# Patient Record
Sex: Male | Born: 1965 | Race: Black or African American | Hispanic: No | Marital: Single | Smoking: Current every day smoker
Health system: Southern US, Community
[De-identification: ages and names within clinical notes are randomized; demographics above are authoritative.]

## PROBLEM LIST (undated history)

## (undated) DIAGNOSIS — I10 Essential (primary) hypertension: Secondary | ICD-10-CM

---

## 2015-11-08 ENCOUNTER — Emergency Department: Payer: Self-pay

## 2015-11-08 ENCOUNTER — Encounter: Payer: Self-pay | Admitting: Emergency Medicine

## 2015-11-08 ENCOUNTER — Emergency Department
Admission: EM | Admit: 2015-11-08 | Discharge: 2015-11-08 | Disposition: A | Discharge status: Home / Self Care | Payer: Self-pay | Attending: Emergency Medicine | Admitting: Emergency Medicine

## 2015-11-08 DIAGNOSIS — M25432 Effusion, left wrist: Secondary | ICD-10-CM | POA: Insufficient documentation

## 2015-11-08 DIAGNOSIS — I10 Essential (primary) hypertension: Secondary | ICD-10-CM | POA: Insufficient documentation

## 2015-11-08 DIAGNOSIS — M25532 Pain in left wrist: Secondary | ICD-10-CM

## 2015-11-08 DIAGNOSIS — F17200 Nicotine dependence, unspecified, uncomplicated: Secondary | ICD-10-CM | POA: Insufficient documentation

## 2015-11-08 HISTORY — DX: Essential (primary) hypertension: I10

## 2015-11-08 MED ORDER — TRAMADOL HCL 50 MG PO TABS: 50.0000 mg | ORAL_TABLET | Freq: Four times a day (QID) | ORAL | 0 refills | Status: AC | PRN

## 2015-11-08 MED ORDER — IBUPROFEN 800 MG PO TABS: 800.0000 mg | ORAL_TABLET | Freq: Three times a day (TID) | ORAL | 0 refills | Status: AC | PRN

## 2015-11-08 MED ORDER — KETOROLAC TROMETHAMINE 60 MG/2ML IM SOLN
60.0000 mg | Freq: Once | INTRAMUSCULAR | Status: AC
  Administered 2015-11-08: 60 mg via INTRAMUSCULAR
  Filled 2015-11-08: qty 2

## 2015-11-08 NOTE — ED Triage Notes (Signed)
Pt with left wrist pain and swelling for two days. No known injury,.

## 2015-11-08 NOTE — ED Provider Notes (Signed)
[facility] Emergency Department Provider Note   ____________________________________________   First MD Initiated Contact with Patient 11/08/15 [phone]     (approximate)  I have reviewed the triage vital signs and the nursing notes.   HISTORY  Chief Complaint Wrist Pain    HPI Glenn Howell is a 50 y.o. male presents for evaluation of left wrist pain and swelling for 2 days. Denies any known injury. States pain started yesterday and progressively worse unable to sleep last night secondary to throbbing pain. Reports his pain is 10 over 10.   Past Medical History:  Diagnosis Date  . Hypertension     There are no active problems to display for this patient.   History reviewed. No pertinent surgical history.  Prior to Admission medications   Medication Sig Start Date End Date Taking? Authorizing Provider  ibuprofen (ADVIL,MOTRIN) 800 MG tablet Take 1 tablet (800 mg total) by mouth every 8 (eight) hours as needed. 11/08/15   Charmayne Sheer Tana Trefry, PA-C  traMADol (ULTRAM) 50 MG tablet Take 1 tablet (50 mg total) by mouth every 6 (six) hours as needed. 11/08/15 11/07/16  Evangeline Dakin, PA-C    Allergies Review of patient's allergies indicates no known allergies.  No family history on file.  Social History Social History  Substance Use Topics  . Smoking status: Current Every Day Smoker  . Smokeless tobacco: Never Used  . Alcohol use Yes    Review of Systems Constitutional: No fever/chills Cardiovascular: Denies chest pain. Respiratory: Denies shortness of breath. Musculoskeletal: Positive for left wrist pain. Skin: Negative for rash. Neurological: Negative for headaches, focal weakness or numbness.  10-point ROS otherwise negative.  ____________________________________________   PHYSICAL EXAM:  VITAL SIGNS: ED Triage Vitals  Enc Vitals Group     BP 11/08/15 0724 (!) 160/113     Pulse Rate 11/08/15 0724 100     Resp 11/08/15 0724 20   Temp 11/08/15 0724 97.9 F (36.6 C)     Temp Source 11/08/15 0724 Oral     SpO2 11/08/15 0724 100 %     Weight 11/08/15 0725 205 lb (93 kg)     Height 11/08/15 0725 5\' 10"  (1.778 m)     Head Circumference --      Peak Flow --      Pain Score 11/08/15 0725 8     Pain Loc --      Pain Edu? --      Excl. in GC? --     Constitutional: Alert and oriented. Well appearing and in no acute distress. Cardiovascular: Normal rate, regular rhythm. Grossly normal heart sounds.  Good peripheral circulation. Respiratory: Normal respiratory effort.  No retractions. Lungs CTAB. Musculoskeletal: Left wrist with limited range of motion. Mild warmth. Increased pain with flexion and extension. Distally neurovascularly intact with good capillary refill. Neurologic:  Normal speech and language. No gross focal neurologic deficits are appreciated. No gait instability. Skin:  Skin is warm, dry and intact. No rash noted. Psychiatric: Mood and affect are normal. Speech and behavior are normal.  ____________________________________________   LABS (all labs ordered are listed, but only abnormal results are displayed)  Labs Reviewed - No data to display ____________________________________________  EKG   ____________________________________________  RADIOLOGY  No acute osseous findings. ____________________________________________   PROCEDURES  Procedure(s) performed: None  Procedures  Critical Care performed: No  ____________________________________________   INITIAL IMPRESSION / ASSESSMENT AND PLAN / ED COURSE  Pertinent labs & imaging results that were available  during my care of the patient were reviewed by me and considered in my medical decision making (see chart for details).  Nonspecific left wrist pain. Ace wrap provided as needed for comfort Rx given for ibuprofen and tramadol. She didn't follow up PCP or orthopedics on call if continued pain.  Clinical Course      ____________________________________________   FINAL CLINICAL IMPRESSION(S) / ED DIAGNOSES  Final diagnoses:  Left wrist pain  Wrist pain, acute, left      NEW MEDICATIONS STARTED DURING THIS VISIT:  New Prescriptions   IBUPROFEN (ADVIL,MOTRIN) 800 MG TABLET    Take 1 tablet (800 mg total) by mouth every 8 (eight) hours as needed.   TRAMADOL (ULTRAM) 50 MG TABLET    Take 1 tablet (50 mg total) by mouth every 6 (six) hours as needed.     Note:  This document was prepared using Dragon voice recognition software and may include unintentional dictation errors.   Evangeline Dakinharles M Anvay Tennis, PA-C 11/08/15 0911    Evangeline Dakinharles M Mervyn Pflaum, PA-C 11/08/15 1045    Myrna Blazeravid Matthew Schaevitz, MD 11/08/15 [phone]

## 2019-07-10 ENCOUNTER — Emergency Department: Payer: Self-pay

## 2019-07-10 ENCOUNTER — Inpatient Hospital Stay
Admission: EM | Admit: 2019-07-10 | Discharge: 2019-07-11 | DRG: 281 | Discharge status: Against Medical Advice | Payer: Self-pay | Attending: Internal Medicine | Admitting: Internal Medicine

## 2019-07-10 ENCOUNTER — Other Ambulatory Visit: Payer: Self-pay

## 2019-07-10 DIAGNOSIS — Z79899 Other long term (current) drug therapy: Secondary | ICD-10-CM

## 2019-07-10 DIAGNOSIS — Z20822 Contact with and (suspected) exposure to covid-19: Secondary | ICD-10-CM | POA: Diagnosis present

## 2019-07-10 DIAGNOSIS — D649 Anemia, unspecified: Secondary | ICD-10-CM

## 2019-07-10 DIAGNOSIS — F1010 Alcohol abuse, uncomplicated: Secondary | ICD-10-CM | POA: Diagnosis present

## 2019-07-10 DIAGNOSIS — F17200 Nicotine dependence, unspecified, uncomplicated: Secondary | ICD-10-CM | POA: Diagnosis present

## 2019-07-10 DIAGNOSIS — R7401 Elevation of levels of liver transaminase levels: Secondary | ICD-10-CM | POA: Diagnosis present

## 2019-07-10 DIAGNOSIS — I10 Essential (primary) hypertension: Secondary | ICD-10-CM | POA: Diagnosis present

## 2019-07-10 DIAGNOSIS — D500 Iron deficiency anemia secondary to blood loss (chronic): Secondary | ICD-10-CM | POA: Diagnosis present

## 2019-07-10 DIAGNOSIS — R1011 Right upper quadrant pain: Secondary | ICD-10-CM

## 2019-07-10 DIAGNOSIS — I214 Non-ST elevation (NSTEMI) myocardial infarction: Principal | ICD-10-CM | POA: Diagnosis present

## 2019-07-10 DIAGNOSIS — I959 Hypotension, unspecified: Secondary | ICD-10-CM | POA: Diagnosis present

## 2019-07-10 DIAGNOSIS — K922 Gastrointestinal hemorrhage, unspecified: Secondary | ICD-10-CM | POA: Diagnosis present

## 2019-07-10 DIAGNOSIS — K760 Fatty (change of) liver, not elsewhere classified: Secondary | ICD-10-CM | POA: Diagnosis present

## 2019-07-10 DIAGNOSIS — N170 Acute kidney failure with tubular necrosis: Secondary | ICD-10-CM

## 2019-07-10 DIAGNOSIS — R195 Other fecal abnormalities: Secondary | ICD-10-CM | POA: Diagnosis present

## 2019-07-10 DIAGNOSIS — E871 Hypo-osmolality and hyponatremia: Secondary | ICD-10-CM | POA: Diagnosis present

## 2019-07-10 DIAGNOSIS — N179 Acute kidney failure, unspecified: Secondary | ICD-10-CM | POA: Diagnosis present

## 2019-07-10 LAB — CBC
HCT: 24.3 % — ABNORMAL LOW (ref 39.0–52.0)
Hemoglobin: 6.6 g/dL — ABNORMAL LOW (ref 13.0–17.0)
MCH: 17.4 pg — ABNORMAL LOW (ref 26.0–34.0)
MCHC: 27.2 g/dL — ABNORMAL LOW (ref 30.0–36.0)
MCV: 63.9 fL — ABNORMAL LOW (ref 80.0–100.0)
Platelets: 736 10*3/uL — ABNORMAL HIGH (ref 150–400)
RBC: 3.8 MIL/uL — ABNORMAL LOW (ref 4.22–5.81)
RDW: 21.1 % — ABNORMAL HIGH (ref 11.5–15.5)
WBC: 10.6 10*3/uL — ABNORMAL HIGH (ref 4.0–10.5)
nRBC: 0 % (ref 0.0–0.2)

## 2019-07-10 LAB — PROTIME-INR
INR: 1 (ref 0.8–1.2)
Prothrombin Time: 12.9 seconds (ref 11.4–15.2)

## 2019-07-10 LAB — BASIC METABOLIC PANEL
Anion gap: 7 (ref 5–15)
BUN: 17 mg/dL (ref 6–20)
CO2: 23 mmol/L (ref 22–32)
Calcium: 8.3 mg/dL — ABNORMAL LOW (ref 8.9–10.3)
Chloride: 104 mmol/L (ref 98–111)
Creatinine, Ser: 1.39 mg/dL — ABNORMAL HIGH (ref 0.61–1.24)
GFR calc Af Amer: >60 mL/min (ref >60)
GFR calc non Af Amer: 57 mL/min — ABNORMAL LOW (ref >60)
Glucose, Bld: 109 mg/dL — ABNORMAL HIGH (ref 70–99)
Potassium: 3.6 mmol/L (ref 3.5–5.1)
Sodium: 134 mmol/L — ABNORMAL LOW (ref 135–145)

## 2019-07-10 LAB — HEPATIC FUNCTION PANEL
ALT: 39 U/L (ref 0–44)
AST: 96 U/L — ABNORMAL HIGH (ref 15–41)
Albumin: 3.3 g/dL — ABNORMAL LOW (ref 3.5–5.0)
Alkaline Phosphatase: 72 U/L (ref 38–126)
Bilirubin, Direct: <0.1 mg/dL (ref 0.0–0.2)
Total Bilirubin: 0.7 mg/dL (ref 0.3–1.2)
Total Protein: 6.5 g/dL (ref 6.5–8.1)

## 2019-07-10 LAB — APTT: aPTT: 37 seconds — ABNORMAL HIGH (ref 24–36)

## 2019-07-10 LAB — MRSA PCR SCREENING: MRSA by PCR: NEGATIVE

## 2019-07-10 LAB — TROPONIN I (HIGH SENSITIVITY)
Troponin I (High Sensitivity): 9635 ng/L (ref <18)
Troponin I (High Sensitivity): 11923 ng/L (ref <18)

## 2019-07-10 LAB — LIPASE, BLOOD: Lipase: 23 U/L (ref 11–51)

## 2019-07-10 LAB — ABO/RH: ABO/RH(D): O POS

## 2019-07-10 LAB — PREPARE RBC (CROSSMATCH)

## 2019-07-10 MED ORDER — SODIUM CHLORIDE 0.9 % IV BOLUS: Freq: Once | INTRAVENOUS | Status: DC

## 2019-07-10 MED ORDER — DOCUSATE SODIUM 100 MG PO CAPS: 100.0000 mg | ORAL_CAPSULE | Freq: Two times a day (BID) | ORAL | Status: DC | PRN

## 2019-07-10 MED ORDER — ASPIRIN 81 MG PO CHEW: 324.0000 mg | CHEWABLE_TABLET | Freq: Once | ORAL | Status: DC

## 2019-07-10 MED ORDER — SODIUM CHLORIDE 0.9 % IV BOLUS
500.0000 mL | Freq: Once | INTRAVENOUS | Status: AC
  Administered 2019-07-10: 500 mL via INTRAVENOUS

## 2019-07-10 MED ORDER — HEPARIN (PORCINE) 25000 UT/250ML-% IV SOLN
1000.0000 [IU]/h | INTRAVENOUS | Status: DC
  Administered 2019-07-10: 1000 [IU]/h via INTRAVENOUS
  Filled 2019-07-10: qty 250

## 2019-07-10 MED ORDER — ONDANSETRON HCL 4 MG/2ML IJ SOLN: 4.0000 mg | Freq: Four times a day (QID) | INTRAMUSCULAR | Status: DC | PRN

## 2019-07-10 MED ORDER — LORAZEPAM 2 MG/ML IJ SOLN: 1.0000 mg | INTRAMUSCULAR | Status: DC | PRN

## 2019-07-10 MED ORDER — MORPHINE SULFATE (PF) 2 MG/ML IV SOLN
1.0000 mg | INTRAVENOUS | Status: DC | PRN
  Administered 2019-07-11: 2 mg via INTRAVENOUS
  Filled 2019-07-10: qty 1

## 2019-07-10 MED ORDER — SODIUM CHLORIDE 0.9 % IV SOLN
80.0000 mg | Freq: Once | INTRAVENOUS | Status: AC
  Administered 2019-07-10: 80 mg via INTRAVENOUS
  Filled 2019-07-10: qty 80

## 2019-07-10 MED ORDER — NITROGLYCERIN IN D5W 200-5 MCG/ML-% IV SOLN
0.0000 ug/min | INTRAVENOUS | Status: DC
  Administered 2019-07-10: 5 ug/min via INTRAVENOUS
  Filled 2019-07-10: qty 250

## 2019-07-10 MED ORDER — CHLORHEXIDINE GLUCONATE CLOTH 2 % EX PADS
6.0000 | MEDICATED_PAD | Freq: Every day | CUTANEOUS | Status: DC
  Administered 2019-07-10 – 2019-07-11 (×2): 6 via TOPICAL

## 2019-07-10 MED ORDER — LORAZEPAM 1 MG PO TABS: 1.0000 mg | ORAL_TABLET | ORAL | Status: DC | PRN

## 2019-07-10 MED ORDER — THIAMINE HCL 100 MG/ML IJ SOLN
100.0000 mg | Freq: Every day | INTRAMUSCULAR | Status: DC
  Administered 2019-07-11: 100 mg via INTRAVENOUS
  Filled 2019-07-10: qty 2

## 2019-07-10 MED ORDER — NITROGLYCERIN 0.4 MG SL SUBL
0.4000 mg | SUBLINGUAL_TABLET | SUBLINGUAL | Status: DC | PRN
  Administered 2019-07-10 (×2): 0.4 mg via SUBLINGUAL
  Filled 2019-07-10: qty 1

## 2019-07-10 MED ORDER — PANTOPRAZOLE SODIUM 40 MG IV SOLR: 40.0000 mg | Freq: Two times a day (BID) | INTRAVENOUS | Status: DC

## 2019-07-10 MED ORDER — THIAMINE HCL 100 MG PO TABS: 100.0000 mg | ORAL_TABLET | Freq: Every day | ORAL | Status: DC

## 2019-07-10 MED ORDER — ACETAMINOPHEN 325 MG PO TABS: 650.0000 mg | ORAL_TABLET | ORAL | Status: DC | PRN

## 2019-07-10 MED ORDER — SODIUM CHLORIDE 0.9 % IV SOLN
8.0000 mg/h | INTRAVENOUS | Status: DC
  Administered 2019-07-10 – 2019-07-11 (×2): 8 mg/h via INTRAVENOUS
  Filled 2019-07-10 (×2): qty 80

## 2019-07-10 MED ORDER — FOLIC ACID 5 MG/ML IJ SOLN
1.0000 mg | Freq: Every day | INTRAMUSCULAR | Status: DC
  Filled 2019-07-10 (×2): qty 0.2

## 2019-07-10 NOTE — ED Notes (Signed)
Pt showing 12 beats of ventricular tachycardia on monitor. Associate Professor notified. Triplett, FNP provided with printed report.

## 2019-07-10 NOTE — ED Provider Notes (Signed)
[facility] Emergency Department Provider Note  ____________________________________________   First MD Initiated Contact with Patient 07/10/19 1656     (approximate)  I have reviewed the triage vital signs and the nursing notes.   HISTORY  Chief Complaint Chest Pain  HPI Glenn Howell is a 54 y.o. male who presents emergency department for treatment and evaluation of 2 days of chest pain.  Patient states that he was at work when he noticed his chest feeling kind of tight.  He thought that it was indigestion and so he took some over-the-counter heartburn relief medications.   He denies previous cardiac disease.  He has never had a reason to be evaluated by cardiology.  He does have a history of hypertension.  He states that the pain has gotten worse today. No appetite for the past 24 hours.    Past Medical History:  Diagnosis Date   Hypertension     Patient Active Problem List   Diagnosis Date Noted   GI bleed 07/10/2019    History reviewed. No pertinent surgical history.  Prior to Admission medications   Medication Sig Start Date End Date Taking? Authorizing Provider  ibuprofen (ADVIL,MOTRIN) 800 MG tablet Take 1 tablet (800 mg total) by mouth every 8 (eight) hours as needed. 11/08/15   Beers, Charmayne Sheer, PA-C    Allergies Patient has no known allergies.  No family history on file.  Social History Social History   Tobacco Use   Smoking status: Current Every Day Smoker   Smokeless tobacco: Never Used  Substance Use Topics   Alcohol use: Yes   Drug use: Not on file    Review of Systems  Constitutional: No fever/chills. Eyes: No visual changes. ENT: No sore throat. Cardiovascular: Positive for chest pain. Negative for pleuritic pain. Negative for palpitations. Negataive for leg pain. Respiratory: Negative shortness of breath. Gastrointestinal: Positive for right upper abdominal pain. Negative for nausea, No vomiting.  No diarrhea.   No constipation. Genitourinary: Negative for dysuria. Musculoskeletal: Positive for neck and back pain.  Skin: Negative for rash, lesion, wound. Neurological: Negative for headaches, focal weakness or numbness.  ____________________________________________   PHYSICAL EXAM:  VITAL SIGNS: ED Triage Vitals  Enc Vitals Group     BP 07/10/19 1640 125/70     Pulse Rate 07/10/19 1640 87     Resp 07/10/19 1640 20     Temp 07/10/19 1644 (!) 97.5 F (36.4 C)     Temp Source 07/10/19 1644 Oral     SpO2 07/10/19 1644 96 %     Weight 07/10/19 1642 250 lb (113.4 kg)     Height 07/10/19 1642 5\' 11"  (1.803 m)     Head Circumference --      Peak Flow --      Pain Score 07/10/19 1641 10     Pain Loc --      Pain Edu? --      Excl. in GC? --     Constitutional: Alert and oriented. Acutely ill appearing and in no acute distress. normal mental status. Eyes: Conjunctivae are normal. PERRL. Head: Atraumatic. Nose: No congestion/rhinnorhea. Mouth/Throat: Mucous membranes are moist.  Oropharynx non-erythematous. Tongue normal in size and color. Neck: No stridor. No carotid bruit appreciated on exam. Hematological/Lymphatic/Immunilogical: No cervical lymphadenopathy. Cardiovascular: Normal rate, regular rhythm. Grossly normal heart sounds.  Good peripheral circulation. Respiratory: Normal respiratory effort.  No retractions. Lungs CTAB. Gastrointestinal: Soft and tender in RUQ on exam. Rotund. No abdominal bruits. No  CVA tenderness.  Genitourinary: Exam deferred. Musculoskeletal: No lower extremity tenderness. No edema of extremities. Neurologic:  Normal speech and language. No gross focal neurologic deficits are appreciated. Skin:  Skin is warm, dry and intact. No rash noted. Psychiatric: Mood and affect are normal. Speech and behavior are normal.  ____________________________________________   LABS (all labs ordered are listed, but only abnormal results are displayed)  Labs Reviewed    BASIC METABOLIC PANEL - Abnormal; Notable for the following components:      Result Value   Sodium 134 (*)    Glucose, Bld 109 (*)    Creatinine, Ser 1.39 (*)    Calcium 8.3 (*)    GFR calc non Af Amer 57 (*)    All other components within normal limits  CBC - Abnormal; Notable for the following components:   WBC 10.6 (*)    RBC 3.80 (*)    Hemoglobin 6.6 (*)    HCT 24.3 (*)    MCV 63.9 (*)    MCH 17.4 (*)    MCHC 27.2 (*)    RDW 21.1 (*)    Platelets 736 (*)    All other components within normal limits  HEPATIC FUNCTION PANEL - Abnormal; Notable for the following components:   Albumin 3.3 (*)    AST 96 (*)    All other components within normal limits  APTT - Abnormal; Notable for the following components:   aPTT 37 (*)    All other components within normal limits  TROPONIN I (HIGH SENSITIVITY) - Abnormal; Notable for the following components:   Troponin I (High Sensitivity) 9,635 (*)    All other components within normal limits  TROPONIN I (HIGH SENSITIVITY) - Abnormal; Notable for the following components:   Troponin I (High Sensitivity) 11,923 (*)    All other components within normal limits  SARS CORONAVIRUS 2 BY RT PCR (HOSPITAL ORDER, PERFORMED IN [facility])  PROTIME-INR  LIPASE, BLOOD  IRON AND TIBC  FERRITIN  CBC  HEPARIN LEVEL (UNFRACTIONATED)  HIV ANTIBODY (ROUTINE TESTING W REFLEX)  PHOSPHORUS  MAGNESIUM  MAGNESIUM  PHOSPHORUS  TYPE AND SCREEN  PREPARE RBC (CROSSMATCH)  ABO/RH   ____________________________________________  EKG  ED ECG REPORT I, Amariona Rathje, FNP-BC personally viewed and interpreted this ECG.   Date: 07/10/2019  EKG Time: 1642  Rate: 83  Rhythm: Sinus rhythm with PACs  Axis: normal  Intervals:none  ST&T Change: ST depression V5 V6.  ED ECG REPORT I, Delora Gravatt, FNP-BC personally viewed and interpreted this ECG.   Date: 07/10/2019  EKG Time: 1756  Rate: 85  Rhythm: unchanged from previous tracings   Axis: normal  Intervals:none  ST&T Change: ST depression V5 V6.   ____________________________________________  RADIOLOGY  ED MD interpretation:  Cardiomegaly likely.  I, Kem Boroughs, personally viewed and evaluated these images (plain radiographs) as part of my medical decision making, as well as reviewing the written report by the radiologist.  Official radiology report(s): DG Chest Portable 1 View  Result Date: 07/10/2019 CLINICAL DATA:  Chest pain. EXAM: PORTABLE CHEST 1 VIEW COMPARISON:  None. FINDINGS: Apparent cardiomegaly. Haziness over the left base. No overt edema. No nodules or masses. No other acute abnormalities. IMPRESSION: Haziness over the left base may be due to patient rotation. It would be difficult to exclude a developing opacity. A PA and lateral chest x-ray is recommended for better evaluation. No overt edema. Cardiomegaly suspected. Electronically Signed   By: Gerome Sam III M.D  On: 07/10/2019 17:11   US Abdomen Limited RUQ  Result Date: 07/10/2019 CLINICAL DATA:  Right upper quadrant pain times 2-3 days EXAM: ULTRASOUND ABDOMEN LIMITED RIGHT UPPER QUADRANT COMPARISON:  None. FINDINGS: Gallbladder: Multiple small, shadowing echogenic gallstones are seen within the gallbladder lumen (the largest measures approximately 8 mm). There is no evidence of gallbladder wall thickening (1.5 mm). No sonographic Murphy sign noted by sonographer. Common bile duct: Diameter: 2.4 mm Liver: No focal lesion identified. There is diffusely increased echogenicity of the liver parenchyma. Portal vein is patent on color Doppler imaging with normal direction of blood flow towards the liver. Other: None. IMPRESSION: 1. Cholelithiasis, without evidence of acute cholecystitis. 2. Fatty liver. Electronically Signed   By: Aram Candela M.D.   On: 07/10/2019 17:40    ____________________________________________   PROCEDURES  Procedure(s) performed: None  Procedures  Critical  Care performed: Yes, see critical care note(s)  ____________________________________________   INITIAL IMPRESSION / ASSESSMENT AND PLAN  54 year old male presenting to the emergency department via EMS for treatment and evaluation of chest pain that started 2 days ago.  On exam he also has some right upper quadrant tenderness.  Plan will be to do a cardiac work-up as well as ultrasound of the right upper quadrant to rule out cholecystitis.  Differential diagnosis includes, but not limited to:  CAD, MI, acute cholecystitis   ED COURSE  Troponin 9,635. Chest pain 6/10. Will order NTG and heparin. No change on ECG compared to initial.  Hemoglobin 6.6. Patient denies a known history of anemia. He has noticed blood when wiping after bowel movement for the past few months. He has never had a colonoscopy.  Heme positive without gross melena on DRE.  Discussed with the patient the need to transfuse blood.  Patient is agreeable and nursing staff will obtain consent.  Run of Vtach noted on bedside monitor. Converted without intervention.   Cardiology paged for consult. Spoke with Dr. Darrold Junker who does not plan to take him to the cath lab emergently as the pain started 2 days ago and he is anemic requiring blood.   Patient's pain some better, but not completely relieved. Nitroglycerin drip ordered. Second troponin 11,923.   Accepted for admission by Intensivist.   As part of my medical decision making, I reviewed the following data within the electronic MEDICAL RECORD NUMBER Evaluated by EM attending Dr. Scotty Court.  CRITICAL CARE Performed by: Kem Boroughs   Total critical care time: 45 minutes  Critical care time was exclusive of separately billable procedures and treating other patients.  Critical care was necessary to treat or prevent imminent or life-threatening deterioration.  Critical care was time spent personally by me on the following activities: development of treatment plan with  patient and/or surrogate as well as nursing, discussions with consultants, evaluation of patient's response to treatment, examination of patient, obtaining history from patient or surrogate, ordering and performing treatments and interventions, ordering and review of laboratory studies, ordering and review of radiographic studies, pulse oximetry and re-evaluation of patient's condition.  ____________________________________________     FINAL CLINICAL IMPRESSION(S) / ED DIAGNOSES  Final diagnoses:  RUQ pain  NSTEMI (non-ST elevated myocardial infarction) (HCC)  Anemia, unspecified type     ED Discharge Orders    None       NORM WRAY was evaluated in Emergency Department on 07/10/2019 for the symptoms described in the history of present illness. He was evaluated in the context of the global COVID-19 pandemic, which necessitated consideration  that the patient might be at risk for infection with the SARS-CoV-2 virus that causes COVID-19. Institutional protocols and algorithms that pertain to the evaluation of patients at risk for COVID-19 are in a state of rapid change based on information released by regulatory bodies including the CDC and federal and state organizations. These policies and algorithms were followed during the patient's care in the ED.   Note:  This document was prepared using Dragon voice recognition software and may include unintentional dictation errors.   Victorino Dike, FNP 07/10/19 2012    Carrie Mew, MD 07/10/19 2020

## 2019-07-10 NOTE — ED Notes (Signed)
Patient assisted to the bathroom 

## 2019-07-10 NOTE — ED Triage Notes (Signed)
Pt arrives via EMS from home with complaints of 10/10 CP that started yesterday while he was at work- pt was given 3 nitro sprays and 324 ASA by EMS- pt having swelling in bilateral lower extremities pt BP dropped after 3rd nitro to 85 systolic and pt became confused

## 2019-07-10 NOTE — ED Notes (Signed)
Patient given warm blankets.

## 2019-07-10 NOTE — Consult Note (Addendum)
PHARMACY CONSULT NOTE - FOLLOW UP  Pharmacy Consult for Electrolyte Monitoring and Replacement   Recent Labs: Potassium (mmol/L)  Date Value  07/10/2019 3.6   Calcium (mg/dL)  Date Value  44/04/4740 8.3 (L)   Albumin (g/dL)  Date Value  59/56/3875 3.3 (L)   Sodium (mmol/L)  Date Value  07/10/2019 134 (L)     Assessment: Pharmacy has been consulted to monitor and replace electrolytes in 53yo patient admitted to ICU with acute GI bleed and NSTEMI requiring nitroglycerin drip.   Goal of Therapy:  Electrolytes WNL, K~4.0, Mg~2.0  Plan:  No replacement needed currently.  Will recheck electrolytes with AM labs.  Bettey Costa ,PharmD Clinical Pharmacist 07/10/2019 9:54 PM

## 2019-07-10 NOTE — ED Notes (Signed)
Attempted to call report but states that they are not ready at this time.

## 2019-07-10 NOTE — Progress Notes (Signed)
Called to consider emergent cardiac catheterization and possible PCI. Patient with 1-2 day history of chest pain and elevated troponin ( 9635 ), without evidence for STEMI ( ECG show SR with lateral T wave abnormalities ). Also, patient with marked anemia  H & H, 6.6 & 24.3, respectively with heme positive stools consistent with active GI bleed. Cardiac cath and PCI would require heparin bolus of 10-15,000 units to achieve ACT 300, plus dual antiplatelet therapy which would likely exacerbate GI bleed. Would recommend against emergent cardiac cath and proceed with initial conservative management with transfusion to achieve target H & H, 10 & 30, respectively.

## 2019-07-10 NOTE — Progress Notes (Signed)
eLink Physician-Brief Progress Note Patient Name: Glenn Howell DOB: 11/21/1965 MRN: [id number]   Date of Service  07/10/2019  HPI/Events of Note  54 year old man being admitted with acute MI and GI bleed. Current hemodynamics are stable. Planned for IV PPI, nitrates, blood transfusion, GI and cardiology consults.   eICU Interventions  Defer MI management to cardiology Planned for stabilization before cath, 2 units RBC and then post transfusion CBC to be done Would aim for Hb close to 10 given acute MI Bedside CCM admitting Please call E link if needed Will need serial CBC, renal labs and cardiac markers     Intervention Category Major Interventions: Hemorrhage - evaluation and management;Other: Evaluation Type: New Patient Evaluation  Oretha Milch 07/10/2019, 10:23 PM

## 2019-07-10 NOTE — ED Notes (Addendum)
Assigned bed @ 2012, spoke with RN Elon Jester

## 2019-07-10 NOTE — H&P (Addendum)
Name: Glenn Howell MRN: [id number] DOB: 07/14/1965    ADMISSION DATE:  07/10/2019 CONSULTATION DATE: 07/10/2019  REFERRING MD : Sherrie George, NP   CHIEF COMPLAINT: Chest Pain   BRIEF PATIENT DESCRIPTION:  54 yo male admitted with acute GI bleed and NSTEMI requiring nitroglycerin gtt   SIGNIFICANT EVENTS/STUDIES:  05/23: Pt admitted to ICU with acute GI bleed and NSTEMI  05/23: [place] Abd Limited RUQ revealed cholelithiasis, without evidence of acute cholecystitis. Fatty liver  HISTORY OF PRESENT ILLNESS:   This is a 54 yo male with a PMH of HTN, ETOH Abuse, and Tobacco Abuse. He presented to [facility] ER on 05/23 with c/o 10/10 chest pain and decreased appetite, onset 05/22 while he was at work.  He initially thought the chest pain was secondary to indigestion, therefore he took over the counter heartburn medication.  However, due to worsening chest pain today he notified EMS.  Upon EMS arrival pt c/o 10/10 chest pain, and noted to have bilateral lower extremity swelling.  EMS administered 3 nitro sprays and aspirin. Following the 3rd nitro pt developed confusion and became hypotensive sbp 85.  In the ER EKG revealed normal sinus rhythm with ST depression.  Lab results revealed: Na+ 134, glucose 109, creatinine 1.39, calcium 8.3, AST 96, troponin 9,635, hgb 6.6, hct 24.3, and occult stool positive. Pt endorsed noticing blood in his stool over the past few months. He denies taking NSAID's, anticoagulants, or herbal remedies, however he does endorse ETOH abuse stating he drinks alcohol every other day, but is uncertain of the amount.  He denies drug abuse.  CXR concerning for possible LLL opacity. Pt also complained of persistent chest pain (6/10), therefore nitroglycerin gtt ordered. Cardiologist Dr. Saralyn Pilar consulted, pt did not meet STEMI criteria. Dr. Lorinda Creed recommended against emergent cardiac cath due to acute GI bleed. PCCM team contacted by ER provider for ICU admission.   PAST MEDICAL HISTORY  :   has a past medical history of Hypertension.  has no past surgical history on file. Prior to Admission medications   Medication Sig Start Date End Date Taking? Authorizing Provider  ibuprofen (ADVIL,MOTRIN) 800 MG tablet Take 1 tablet (800 mg total) by mouth every 8 (eight) hours as needed. 11/08/15   Beers, Pierce Crane, PA-C   No Known Allergies  FAMILY HISTORY:  family history is not on file. SOCIAL HISTORY:  reports that he has been smoking. He has never used smokeless tobacco. He reports current alcohol use.  REVIEW OF SYSTEMS: Positives in BOLD  Constitutional: Negative for fever, chills, weight loss, malaise/fatigue and diaphoresis.  HENT: Negative for hearing loss, ear pain, nosebleeds, congestion, sore throat, neck pain, tinnitus and ear discharge.   Eyes: Negative for blurred vision, double vision, photophobia, pain, discharge and redness.  Respiratory: Negative for cough, hemoptysis, sputum production, shortness of breath, wheezing and stridor.   Cardiovascular: chest pain, palpitations, orthopnea, claudication, leg swelling and PND.  Gastrointestinal: heartburn, nausea, vomiting, abdominal pain, diarrhea, constipation, blood in stool and melena.  Genitourinary: Negative for dysuria, urgency, frequency, hematuria and flank pain.  Musculoskeletal: Negative for myalgias, back pain, joint pain and falls.  Skin: Negative for itching and rash.  Neurological: Negative for dizziness, tingling, tremors, sensory change, speech change, focal weakness, seizures, loss of consciousness, weakness and headaches.  Endo/Heme/Allergies: Negative for environmental allergies and polydipsia. Does not bruise/bleed easily.  SUBJECTIVE:  Pt c/o shortness of breath and 7/10 chest pain   VITAL SIGNS: Temp:  [97.5 F (36.4 C)-97.6 F (36.4  C)] 97.5 F (36.4 C) (05/23 2003) Pulse Rate:  [80-87] 86 (05/23 2003) Resp:  [13-25] 20 (05/23 2003) BP: (102-135)/(55-87) 135/82 (05/23 2003) SpO2:  [96  %-99 %] 99 % (05/23 2003) Weight:  [113.4 kg] 113.4 kg (05/23 1642)  PHYSICAL EXAMINATION: General: well developed, well nourished male, NAD resting in bed  Neuro: alert and oriented, follows commands HEENT: supple, no JVD  Cardiovascular: nsr, rrr, no R/G Lungs: faint rhonchi bilateral bases, slightly tachypneic  Abdomen: +BS x4, obese, soft, non tender, non distended Musculoskeletal: normal bulk and tone Skin: intact no rashes or lesions present   Recent Labs  Lab 07/10/19 1647  NA 134*  K 3.6  CL 104  CO2 23  BUN 17  CREATININE 1.39*  GLUCOSE 109*   Recent Labs  Lab 07/10/19 1647  HGB 6.6*  HCT 24.3*  WBC 10.6*  PLT 736*   DG Chest Portable 1 View  Result Date: 07/10/2019 CLINICAL DATA:  Chest pain. EXAM: PORTABLE CHEST 1 VIEW COMPARISON:  None. FINDINGS: Apparent cardiomegaly. Haziness over the left base. No overt edema. No nodules or masses. No other acute abnormalities. IMPRESSION: Haziness over the left base may be due to patient rotation. It would be difficult to exclude a developing opacity. A PA and lateral chest x-ray is recommended for better evaluation. No overt edema. Cardiomegaly suspected. Electronically Signed   By: Gerome Sam III M.D   On: 07/10/2019 17:11   US Abdomen Limited RUQ  Result Date: 07/10/2019 CLINICAL DATA:  Right upper quadrant pain times 2-3 days EXAM: ULTRASOUND ABDOMEN LIMITED RIGHT UPPER QUADRANT COMPARISON:  None. FINDINGS: Gallbladder: Multiple small, shadowing echogenic gallstones are seen within the gallbladder lumen (the largest measures approximately 8 mm). There is no evidence of gallbladder wall thickening (1.5 mm). No sonographic Murphy sign noted by sonographer. Common bile duct: Diameter: 2.4 mm Liver: No focal lesion identified. There is diffusely increased echogenicity of the liver parenchyma. Portal vein is patent on color Doppler imaging with normal direction of blood flow towards the liver. Other: None. IMPRESSION: 1.  Cholelithiasis, without evidence of acute cholecystitis. 2. Fatty liver. Electronically Signed   By: Aram Candela M.D.   On: 07/10/2019 17:40    ASSESSMENT / PLAN:  Elevated troponin secondary to NSTEMI Hx: HTN  Continuous telemetry monitoring Supplemental O2 for dyspnea and/or hypoxia   Trend troponin Prn morphine and nitroglycerin gtt for chest pain management  Echo, lipid panel, and hemoglobin A1c pending  Urine drug screen pending  Cardiology consulted appreciate input-deferring cardiac cath at this time due to acute GI bleed   Acute renal failure likely secondary to poor po intake  Mild hyponatremia  Trend BMP  Replace electrolytes as indicated  Monitor UOP Avoid nephrotoxic medications  NS @75  ml/hr   New dx of fatty liver secondary to ETOH abuse  Trend hepatic panel   Possible LLL opacity  Trend WBC and follow fever curve  Will check PCT if elevated will initiated abx therapy   Acute GI Bleed  VTE px: SCD's, avoid chemical prophylaxis  Trend CBC  Monitor for s/sx of bleeding and transfuse for hgb 10 and hct 30 per cardiology recommendations  Gastroenterology consulted appreciate input  Protonix gtt   ETOH and Tobacco Abuse  CIWA protocol  ETOH and tobacco abuse cessation counseling provided   , AGNP  Pulmonary/Critical Care Pager [phone] (please enter 7 digits) PCCM Consult Pager [phone] (please enter 7 digits)

## 2019-07-10 NOTE — Consult Note (Addendum)
ANTICOAGULATION CONSULT NOTE  Pharmacy Consult for Heparin infusion Indication: chest pain/ACS  No Known Allergies  Patient Measurements: Height: 5\' 11"  (180.3 cm) Weight: 113.4 kg (250 lb) IBW/kg (Calculated) : 75.3 Heparin Dosing Weight: 99.9 kg  Vital Signs: Temp: 97.5 F (36.4 C) (05/23 1644) Temp Source: Oral (05/23 1644) BP: 125/70 (05/23 1640) Pulse Rate: 87 (05/23 1640)  Labs: Recent Labs    07/10/19 1647  HGB 6.6*  HCT 24.3*  PLT 736*  LABPROT 12.9  INR 1.0  CREATININE 1.39*  TROPONINIHS 9,635*    Estimated Creatinine Clearance: 76.9 mL/min (A) (by C-G formula based on SCr of 1.39 mg/dL (H)).   Medications:  No anticoagulation prior to admission per chart review  Assessment: Patient is a 54 y/o M who presented to the ED complaining of chest pain. Troponin elevated to 9635. EKG interpretation pending. Pharmacy has been consulted to initiate heparin infusion for ACS.  Baseline INR 1. Baseline aPTT 37. Baseline CBC significant for Hgb 6.6, platelets 736. Spoke with provider - confirmed okay to proceed with heparin infusion. Patient will be transfused 2 units PRBC.   Goal of Therapy:  Heparin level 0.3-0.7 units/ml Monitor platelets by anticoagulation protocol: Yes   Plan:  -Heparin infusion at 1000 units/hr, no bolus -Heparin level 6 hours after initiation of infusion -Daily CBC per protocol  53  Pharmacy Resident 07/10/2019,6:05 PM

## 2019-07-10 NOTE — ED Notes (Signed)
Report given to Michele, RN.

## 2019-07-11 ENCOUNTER — Inpatient Hospital Stay
Admit: 2019-07-11 | Discharge: 2019-07-11 | Disposition: A | Discharge status: Home / Self Care | Payer: Self-pay | Attending: Critical Care Medicine | Admitting: Critical Care Medicine

## 2019-07-11 DIAGNOSIS — K625 Hemorrhage of anus and rectum: Secondary | ICD-10-CM

## 2019-07-11 DIAGNOSIS — D508 Other iron deficiency anemias: Secondary | ICD-10-CM

## 2019-07-11 DIAGNOSIS — K922 Gastrointestinal hemorrhage, unspecified: Secondary | ICD-10-CM

## 2019-07-11 DIAGNOSIS — I214 Non-ST elevation (NSTEMI) myocardial infarction: Secondary | ICD-10-CM

## 2019-07-11 LAB — PHOSPHORUS
Phosphorus: 2.7 mg/dL (ref 2.5–4.6)
Phosphorus: 3.3 mg/dL (ref 2.5–4.6)
Phosphorus: 3.4 mg/dL (ref 2.5–4.6)

## 2019-07-11 LAB — URINALYSIS, ROUTINE W REFLEX MICROSCOPIC
Bacteria, UA: NONE SEEN
Bilirubin Urine: NEGATIVE
Glucose, UA: NEGATIVE mg/dL
Ketones, ur: NEGATIVE mg/dL
Leukocytes,Ua: NEGATIVE
Nitrite: NEGATIVE
Protein, ur: NEGATIVE mg/dL
Specific Gravity, Urine: 1.019 (ref 1.005–1.030)
pH: 5 (ref 5.0–8.0)

## 2019-07-11 LAB — IRON AND TIBC
Iron: 16 ug/dL — ABNORMAL LOW (ref 45–182)
Saturation Ratios: 4 % — ABNORMAL LOW (ref 17.9–39.5)
TIBC: 410 ug/dL (ref 250–450)
UIBC: 394 ug/dL

## 2019-07-11 LAB — LIPID PANEL
Cholesterol: 137 mg/dL (ref 0–200)
HDL: 25 mg/dL — ABNORMAL LOW (ref >40)
LDL Cholesterol: 93 mg/dL (ref 0–99)
Total CHOL/HDL Ratio: 5.5 RATIO
Triglycerides: 94 mg/dL (ref <150)
VLDL: 19 mg/dL (ref 0–40)

## 2019-07-11 LAB — COMPREHENSIVE METABOLIC PANEL
ALT: 41 U/L (ref 0–44)
AST: 131 U/L — ABNORMAL HIGH (ref 15–41)
Albumin: 3.4 g/dL — ABNORMAL LOW (ref 3.5–5.0)
Alkaline Phosphatase: 71 U/L (ref 38–126)
Anion gap: 7 (ref 5–15)
BUN: 15 mg/dL (ref 6–20)
CO2: 23 mmol/L (ref 22–32)
Calcium: 8.4 mg/dL — ABNORMAL LOW (ref 8.9–10.3)
Chloride: 104 mmol/L (ref 98–111)
Creatinine, Ser: 1.13 mg/dL (ref 0.61–1.24)
GFR calc Af Amer: >60 mL/min (ref >60)
GFR calc non Af Amer: >60 mL/min (ref >60)
Glucose, Bld: 120 mg/dL — ABNORMAL HIGH (ref 70–99)
Potassium: 3.9 mmol/L (ref 3.5–5.1)
Sodium: 134 mmol/L — ABNORMAL LOW (ref 135–145)
Total Bilirubin: 1.5 mg/dL — ABNORMAL HIGH (ref 0.3–1.2)
Total Protein: 6.7 g/dL (ref 6.5–8.1)

## 2019-07-11 LAB — HIV ANTIBODY (ROUTINE TESTING W REFLEX): HIV Screen 4th Generation wRfx: NONREACTIVE

## 2019-07-11 LAB — CBC WITH DIFFERENTIAL/PLATELET
Abs Immature Granulocytes: 0.06 10*3/uL (ref 0.00–0.07)
Basophils Absolute: 0 10*3/uL (ref 0.0–0.1)
Basophils Relative: 0 %
Eosinophils Absolute: 0 10*3/uL (ref 0.0–0.5)
Eosinophils Relative: 0 %
HCT: 29.5 % — ABNORMAL LOW (ref 39.0–52.0)
Hemoglobin: 8.5 g/dL — ABNORMAL LOW (ref 13.0–17.0)
Immature Granulocytes: 0 %
Lymphocytes Relative: 9 %
Lymphs Abs: 1.4 10*3/uL (ref 0.7–4.0)
MCH: 19.6 pg — ABNORMAL LOW (ref 26.0–34.0)
MCHC: 28.8 g/dL — ABNORMAL LOW (ref 30.0–36.0)
MCV: 68.1 fL — ABNORMAL LOW (ref 80.0–100.0)
Monocytes Absolute: 1.9 10*3/uL — ABNORMAL HIGH (ref 0.1–1.0)
Monocytes Relative: 13 %
Neutro Abs: 11.5 10*3/uL — ABNORMAL HIGH (ref 1.7–7.7)
Neutrophils Relative %: 78 %
Platelets: 682 10*3/uL — ABNORMAL HIGH (ref 150–400)
RBC: 4.33 MIL/uL (ref 4.22–5.81)
RDW: 24.6 % — ABNORMAL HIGH (ref 11.5–15.5)
WBC: 14.8 10*3/uL — ABNORMAL HIGH (ref 4.0–10.5)
nRBC: 0 % (ref 0.0–0.2)

## 2019-07-11 LAB — SARS CORONAVIRUS 2 BY RT PCR (HOSPITAL ORDER, PERFORMED IN ~~LOC~~ HOSPITAL LAB): SARS Coronavirus 2: NEGATIVE

## 2019-07-11 LAB — BASIC METABOLIC PANEL
Anion gap: 9 (ref 5–15)
BUN: 17 mg/dL (ref 6–20)
CO2: 21 mmol/L — ABNORMAL LOW (ref 22–32)
Calcium: 8.3 mg/dL — ABNORMAL LOW (ref 8.9–10.3)
Chloride: 104 mmol/L (ref 98–111)
Creatinine, Ser: 1.28 mg/dL — ABNORMAL HIGH (ref 0.61–1.24)
GFR calc Af Amer: >60 mL/min (ref >60)
GFR calc non Af Amer: >60 mL/min (ref >60)
Glucose, Bld: 105 mg/dL — ABNORMAL HIGH (ref 70–99)
Potassium: 3.8 mmol/L (ref 3.5–5.1)
Sodium: 134 mmol/L — ABNORMAL LOW (ref 135–145)

## 2019-07-11 LAB — PREPARE RBC (CROSSMATCH)

## 2019-07-11 LAB — CBC
HCT: 25.4 % — ABNORMAL LOW (ref 39.0–52.0)
Hemoglobin: 7.4 g/dL — ABNORMAL LOW (ref 13.0–17.0)
MCH: 18.9 pg — ABNORMAL LOW (ref 26.0–34.0)
MCHC: 29.1 g/dL — ABNORMAL LOW (ref 30.0–36.0)
MCV: 65 fL — ABNORMAL LOW (ref 80.0–100.0)
Platelets: 704 10*3/uL — ABNORMAL HIGH (ref 150–400)
RBC: 3.91 MIL/uL — ABNORMAL LOW (ref 4.22–5.81)
RDW: 23.9 % — ABNORMAL HIGH (ref 11.5–15.5)
WBC: 12.2 10*3/uL — ABNORMAL HIGH (ref 4.0–10.5)
nRBC: 0 % (ref 0.0–0.2)

## 2019-07-11 LAB — URINE DRUG SCREEN, QUALITATIVE (ARMC ONLY)
Amphetamines, Ur Screen: NOT DETECTED
Barbiturates, Ur Screen: NOT DETECTED
Benzodiazepine, Ur Scrn: NOT DETECTED
Cannabinoid 50 Ng, Ur ~~LOC~~: NOT DETECTED
Cocaine Metabolite,Ur ~~LOC~~: NOT DETECTED
MDMA (Ecstasy)Ur Screen: NOT DETECTED
Methadone Scn, Ur: NOT DETECTED
Opiate, Ur Screen: POSITIVE — AB
Phencyclidine (PCP) Ur S: NOT DETECTED
Tricyclic, Ur Screen: NOT DETECTED

## 2019-07-11 LAB — FERRITIN: Ferritin: 4 ng/mL — ABNORMAL LOW (ref 24–336)

## 2019-07-11 LAB — GLUCOSE, CAPILLARY: Glucose-Capillary: 101 mg/dL — ABNORMAL HIGH (ref 70–99)

## 2019-07-11 LAB — VITAMIN B12: Vitamin B-12: 210 pg/mL (ref 180–914)

## 2019-07-11 LAB — TROPONIN I (HIGH SENSITIVITY): Troponin I (High Sensitivity): 19604 ng/L (ref <18)

## 2019-07-11 LAB — FOLATE: Folate: 5.8 ng/mL — ABNORMAL LOW (ref >5.9)

## 2019-07-11 LAB — HEMOGLOBIN A1C
Hgb A1c MFr Bld: 5.9 % — ABNORMAL HIGH (ref 4.8–5.6)
Mean Plasma Glucose: 122.63 mg/dL

## 2019-07-11 LAB — PROCALCITONIN: Procalcitonin: 0.13 ng/mL

## 2019-07-11 LAB — MAGNESIUM
Magnesium: 1.9 mg/dL (ref 1.7–2.4)
Magnesium: 2 mg/dL (ref 1.7–2.4)
Magnesium: 2.1 mg/dL (ref 1.7–2.4)

## 2019-07-11 LAB — HEMOGLOBIN AND HEMATOCRIT, BLOOD
HCT: 30.1 % — ABNORMAL LOW (ref 39.0–52.0)
Hemoglobin: 8.7 g/dL — ABNORMAL LOW (ref 13.0–17.0)

## 2019-07-11 MED ORDER — SODIUM CHLORIDE 0.9% IV SOLUTION: Freq: Once | INTRAVENOUS | Status: DC

## 2019-07-11 MED ORDER — MAGNESIUM SULFATE IN D5W 1-5 GM/100ML-% IV SOLN
1.0000 g | Freq: Once | INTRAVENOUS | Status: AC
  Administered 2019-07-11: 1 g via INTRAVENOUS
  Filled 2019-07-11: qty 100

## 2019-07-11 NOTE — Discharge Summary (Signed)
Physician Discharge Summary  Patient ID: Glenn Howell MRN: [id number] DOB/AGE: [birth year] 54 y.o.  Admit date: 07/10/2019 Discharge date: 07/11/2019  Admission Diagnoses: NSTEMI GIB  Discharge Diagnoses:  Active Problems:   GI bleed   NSTEMI (non-ST elevated myocardial infarction) (HCC)   Acute renal failure (ARF) (HCC)   Elevated AST (SGOT)   Discharged Condition: [facility]  Hospital Course:  54 year old African-American male that was initially admitted to the intensive care unit complaining of chest pain and dark tarry stools. Was found to have NSTEMI and a concomitant GI bleed. Cardiology was involved however recommending medical management and no intervention. GI also involved. At this time no acute plans for endoscopy given anesthesia risks in the setting of NSTEMI. Patient was in intensive care unit due to need for nitroglycerin infusion. He is received total 1 unit packed red blood cell transfusion. ICU weaning off nitroglycerin infusion at this time. Patient also had an acute kidney injury on presentation this has been improving.    PATIENT OF SOUND MIND AND JUDGMENT, PATIENT IS ALERT AWAKE ORIENTED x 4.  PATIENT WANTS TO SIGN AMA(AGAINST MEDICAL ADVICE)  There is  risk for Acute Bleeding, increased chance of Infection, increased chance of Respiratory Failure and Cardiac Arrest, increased chance of pneumothorax and collapsed lung, as well as increased Stroke and Death.  The patient understand the risks and benefits and have agreed to proceed with signing AMA.  ACTIVE MI HEPARIN INFUSION NITROGLYCERIN INFUSION  GIB PPI AND GI CONSULTATION BLOOD PRODUCTS  PATIENT SIGNED OUT AGAINST MEDICAL ADVICE  Consults: CARDIOLOGY AND     Discharge Exam: Blood pressure (!) 143/97, pulse 98, temperature 100 F (37.8 C), temperature source Axillary, resp. rate (!) 35, height 5\' 11"  (1.803 m), weight 112.8 kg, SpO2 98 %.   Disposition: SIGN  AMA     Signed: 07/11/2019, 4:17 PM

## 2019-07-11 NOTE — Progress Notes (Signed)
54 year old African-American male that was initially admitted to the intensive care unit complaining of chest pain and dark tarry stools.  Was found to have NSTEMI and a concomitant GI bleed.  Cardiology was involved however recommending medical management and no intervention.  GI also involved.  At this time no acute plans for endoscopy given anesthesia risks in the setting of NSTEMI.  Patient was in intensive care unit due to need for nitroglycerin infusion.  He is received total 1 unit packed red blood cell transfusion.  ICU weaning off nitroglycerin infusion at this time.  Patient also had an acute kidney injury on presentation this has been improving.    PATIENT OF SOUND MIND AND JUDGMENT, PATIENT IS ALERT AWAKE ORIENTED x 4.  PATIENT WANTS TO SIGN AMA(AGAINST MEDICAL ADVICE)  There is  risk for Acute Bleeding, increased chance of Infection, increased chance of Respiratory Failure and Cardiac Arrest, increased chance of pneumothorax and collapsed lung, as well as increased Stroke and Death.  The patient understand the risks and benefits and have agreed to proceed with signing AMA.

## 2019-07-11 NOTE — Progress Notes (Signed)
[facility] hospitalist service acceptance note.  54 year old African-American male that was initially admitted to the intensive care unit complaining of chest pain and dark tarry stools.  Was found to have NSTEMI and a concomitant GI bleed.  Cardiology was involved however recommending medical management and no intervention.  GI also involved.  At this time no acute plans for endoscopy given anesthesia risks in the setting of NSTEMI.  Patient was in intensive care unit due to need for nitroglycerin infusion.  He is received total 1 unit packed red blood cell transfusion.  ICU weaning off nitroglycerin infusion at this time.  Patient also had an acute kidney injury on presentation this has been improving.  Per PCCM Dr. Belia Heman patient is stable for downgrade.  [facility] hospitalist service will assume primary care of this patient starting 07/12/2019  Lolita Patella MD

## 2019-07-11 NOTE — Consult Note (Signed)
PHARMACY CONSULT NOTE - FOLLOW UP  Pharmacy Consult for Electrolyte Monitoring and Replacement   Recent Labs: Potassium (mmol/L)  Date Value  07/11/2019 3.9   Magnesium (mg/dL)  Date Value  55/25/8948 1.9   Calcium (mg/dL)  Date Value  34/75/8307 8.4 (L)   Albumin (g/dL)  Date Value  46/00/2984 3.4 (L)   Phosphorus (mg/dL)  Date Value  73/09/5692 2.7   Sodium (mmol/L)  Date Value  07/11/2019 134 (L)   Corrected Ca: 8.8 mg/dL  Assessment: Pharmacy has been consulted to monitor and replace electrolytes in 53yo patient admitted to ICU with acute GI bleed and NSTEMI requiring nitroglycerin drip.   Goal of Therapy:  Electrolytes WNL, K~4.0, Mg~2.0  Plan:   2 grams IV magnesium sulfate x 1  recheck electrolytes with AM labs  Lowella Bandy ,PharmD Clinical Pharmacist 07/11/2019 7:21 AM

## 2019-07-11 NOTE — Consult Note (Signed)
Dear [facility] resources [facility];    Mr. Glenn Howell 12/25/1955 was seen by me previously in consultation.  He was briefly hospitalized from 5 /23 to 5 /24.  Patient has not expected to be released to return to work until Monday July 19, 2019.    Sincerely  Gatsby Chismar D. Juliann Pares, MD

## 2019-07-11 NOTE — Consult Note (Signed)
PHARMACY CONSULT NOTE  Pharmacy Consult for Electrolyte Monitoring and Replacement   Recent Labs: Potassium (mmol/L)  Date Value  07/11/2019 3.9   Magnesium (mg/dL)  Date Value  51/70/0174 1.9   Calcium (mg/dL)  Date Value  94/49/6759 8.4 (L)   Albumin (g/dL)  Date Value  16/38/4665 3.4 (L)   Phosphorus (mg/dL)  Date Value  99/35/7017 2.7   Sodium (mmol/L)  Date Value  07/11/2019 134 (L)   Corrected Ca: 8.9 mg/dL  Assessment: Pharmacy has been consulted to monitor and replace electrolytes in 53yo patient admitted to ICU with acute GI bleed and NSTEMI requiring nitroglycerin drip.   Goal of Therapy:  Potassium 4.0 - 5.1 mmol/L Magnesium 2.0 - 2.4 mg/dL All Other Electrolytes WNL  Plan:   Potassium is borderline low but trending higher: no replacement required today  Magnesium is borderline low and trending down: replace with 1 gram IV magnesium sulfate x 1  recheck next electrolytes with 5/25 AM labs  Lowella Bandy ,PharmD Clinical Pharmacist 07/11/2019 11:26 AM

## 2019-07-11 NOTE — Consult Note (Addendum)
[name] , MD [address], [address], [place], [place], [address] [address], [place], [place], [address] Phone: [phone]  Fax: [phone]  Consultation  Referring Provider:   ICU Dr Wallis Bamberg  Primary Care Physician:  System, Pcp Not In Primary Gastroenterologist: None          Reason for Consultation:     GI bleed.   Date of Admission:  07/10/2019 Date of Consultation:  07/11/2019         HPI:   Glenn Howell is a 54 y.o. male admitted yesterday with NSTEMI, AKI and GI bleed to the ICU.  H/o alcohol and tobacco use.  He presented to the ER on 07/10/2019 with chest pain while he was at work.  In the ER he received nitro sprays and became hypotensive.  ST depression noted in the EKG.  Troponin was elevated.  Found to have a hemoglobin of 6.6 g and a stool occult positive.  He mentioned that he had noticed blood in stool over the last few months.  History of alcohol abuse drinking alcohol every other day.  Did not go angioplasty or angiogram due to concern for GI bleed.  On admission hemoglobin 6.6 g with an MCV of 63.  Platelet count of 736.  No recent labs to compare with.  Initial troponin was 9635.  Normal lipase.  Subsequent troponins have been rising further.  Iron of 16.  Ferritin of 4.  Received blood transfusion.  Denies any abdominal pain or chest pain presently.  The only thing he stated repeatedly was that he wanted to eat.  He says that he has never had a colonoscopy.  No family history of colon cancer or polyps.  He has noticed a few drops of blood when he has a bowel movement.  Bright red in color.  Ongoing for the past month.  Denies any other complaints. Past Medical History:  Diagnosis Date  . Hypertension     History reviewed. No pertinent surgical history.  Prior to Admission medications   Medication Sig Start Date End Date Taking? Authorizing Provider  ibuprofen (ADVIL,MOTRIN) 800 MG tablet Take 1 tablet (800 mg total) by mouth every 8 (eight)  hours as needed. Patient not taking: Reported on 07/11/2019 11/08/15   Evangeline Dakin, PA-C    No family history on file.   Social History   Tobacco Use  . Smoking status: Current Every Day Smoker  . Smokeless tobacco: Never Used  Substance Use Topics  . Alcohol use: Yes  . Drug use: Not on file    Allergies as of 07/10/2019  . (No Known Allergies)    Review of Systems:    All systems reviewed and negative except where noted in HPI.   Physical Exam:  Vital signs in last 24 hours: Temp:  [97.5 F (36.4 C)-98.4 F (36.9 C)] 98.3 F (36.8 C) (05/24 0415) Pulse Rate:  [73-98] 80 (05/24 0600) Resp:  [9-32] 30 (05/24 0600) BP: (102-140)/(55-102) 122/93 (05/24 0415) SpO2:  [95 %-100 %] 97 % (05/24 0600) Weight:  [112.8 kg-113.4 kg] 112.8 kg (05/24 0440) Last BM Date: (PTA) General:   Pleasant, cooperative in NAD Head:  Normocephalic and atraumatic. Eyes:   No icterus.   Conjunctiva pink. PERRLA. Ears:  Normal auditory acuity. Neck:  Supple; no masses or thyroidomegaly Lungs: Respirations even and unlabored. Lungs clear to auscultation bilaterally.   No wheezes, crackles, or rhonchi.  Heart:  Regular rate and rhythm;  Without murmur,  clicks, rubs or gallops Abdomen:  Soft, nondistended, nontender. Normal bowel sounds. No appreciable masses or hepatomegaly.  No rebound or guarding.  Neurologic:  Alert and oriented x3;  grossly normal neurologically. Skin:  Intact without significant lesions or rashes. Cervical Nodes:  No significant cervical adenopathy. Psych:  Alert and cooperative. Normal affect.  LAB RESULTS: Recent Labs    07/10/19 1647 07/10/19 2258 07/11/19 0616  WBC 10.6* 12.2* 14.8*  HGB 6.6* 7.4* 8.5*  HCT 24.3* 25.4* 29.5*  PLT 736* 704* 682*   BMET Recent Labs    07/10/19 1647 07/10/19 2258 07/11/19 0616  NA 134* 134* 134*  K 3.6 3.8 3.9  CL 104 104 104  CO2 23 21* 23  GLUCOSE 109* 105* 120*  BUN 17 17 15   CREATININE 1.39* 1.28* 1.13  CALCIUM  8.3* 8.3* 8.4*   LFT Recent Labs    07/10/19 1647 07/10/19 1647 07/11/19 0616  PROT 6.5   < > 6.7  ALBUMIN 3.3*   < > 3.4*  AST 96*   < > 131*  ALT 39   < > 41  ALKPHOS 72   < > 71  BILITOT 0.7   < > 1.5*  BILIDIR <0.1  --   --   IBILI NOT CALCULATED  --   --    < > = values in this interval not displayed.   PT/INR Recent Labs    07/10/19 1647  LABPROT 12.9  INR 1.0    STUDIES: DG Chest Portable 1 View  Result Date: 07/10/2019 CLINICAL DATA:  Chest pain. EXAM: PORTABLE CHEST 1 VIEW COMPARISON:  None. FINDINGS: Apparent cardiomegaly. Haziness over the left base. No overt edema. No nodules or masses. No other acute abnormalities. IMPRESSION: Haziness over the left base may be due to patient rotation. It would be difficult to exclude a developing opacity. A PA and lateral chest x-ray is recommended for better evaluation. No overt edema. Cardiomegaly suspected. Electronically Signed   By: 07/12/2019 III M.D   On: 07/10/2019 17:11   07/12/2019 Abdomen Limited RUQ  Result Date: 07/10/2019 CLINICAL DATA:  Right upper quadrant pain times 2-3 days EXAM: ULTRASOUND ABDOMEN LIMITED RIGHT UPPER QUADRANT COMPARISON:  None. FINDINGS: Gallbladder: Multiple small, shadowing echogenic gallstones are seen within the gallbladder lumen (the largest measures approximately 8 mm). There is no evidence of gallbladder wall thickening (1.5 mm). No sonographic Murphy sign noted by sonographer. Common bile duct: Diameter: 2.4 mm Liver: No focal lesion identified. There is diffusely increased echogenicity of the liver parenchyma. Portal vein is patent on color Doppler imaging with normal direction of blood flow towards the liver. Other: None. IMPRESSION: 1. Cholelithiasis, without evidence of acute cholecystitis. 2. Fatty liver. Electronically Signed   By: 07/12/2019 M.D.   On: 07/10/2019 17:40      Impression / Plan:   Glenn Howell is a 55 y.o. y/o male presented to the emergency room with chest pain  and was diagnosed with an NSTEMI.  Did not undergo cardiac intervention as there was concern for a GI bleed.  Emergency room note does not suggest any overt blood loss but the test of stool was performed.  This showed it was positive for blood.  Impression  1.  Anemia: Microcytic iron deficiency anemia: This is usually not an acute process but a  process that occurs over weeks if not months.  This is due to chronic blood loss which leads to iron deficiency anemia.  No mention of any  overt bleeding in the form of hematemesis or melena per admission note or ER note.  Patient denies any overt blood loss himself  2.  Stool occult blood test positive: This is a test not to be used for determining if the patient has a GI bleed or not.  This is a test only to be used for colon cancer screening.  A positive test does not rule in or rule out a GI bleed.  3.  In terms of the iron deficiency anemia: Will need IV iron transfusions, blood transfusions, check B12 folate, urine analysis for blood loss.  Check celiac serology.  Cannot perform endoscopic evaluation due to risks of anesthesia due to NSTEMI.  Usually requires at least 6 weeks from the event to be safe to perform the procedure with anesthesia.  If there is overt blood loss in terms of hematochezia or melena or hematemesis then would need to be done sooner.  Thank you for involving me in the care of this patient.    Addendum  Noticed low folate level and low B12 levels [210 is considered to be low although other labs reported as normal] suggest replacement.   LOS: 1 day   Jonathon Bellows, MD  07/11/2019, 8:42 AM

## 2019-07-11 NOTE — Consult Note (Signed)
Dear [facility];    Mr. Glenn Howell 12/25/1955 was seen by me previously in consultation.  He was briefly hospitalized from 5 /23 to 5 /24.  Patient has not expected to be released to return to work until Monday July 19, 2019.    Sincerely  Jefte Carithers D. Alicea Wente, MD    

## 2019-07-11 NOTE — Consult Note (Signed)
CARDIOLOGY CONSULT NOTE               Patient ID: Glenn Howell MRN: [id number] DOB/AGE: 1965-12-02 54 y.o.  Admit date: 07/10/2019 Referring Physician Dr Mortimer Fries Primary Physician none Primary Cardiologist [facility] Reason for Consultation NSTEMI  HPI: 54 year old black male presents with chest pain symptoms suggestive of unstable angina ended up with positive troponin abnormal EKG.  Patient [facility] troponin was up to 11 but found to have anemia as well as heme positive stools GI was consulted patient was transfused 2 units there was no gross bleeding chest pain improved by the next day he felt better denies any previous cardiac history not on any significant medications except ibuprofen patient now is ready to go home and subsequently signed out AMA because he did not want to stay in the hospital any longer but agreed to follow-up with cardiology within 24 to 48 hours. Patient works at [employer] and needed some extra time before he goes back to work so we agreed I will write a note for about a week or so when he decides he wants to return to work in about one week.  Back to school and now is coming home there was 1   Review of systems complete and found to be negative unless listed above     Past Medical History:  Diagnosis Date  . Hypertension     History reviewed. No pertinent surgical history.  Medications Prior to Admission  Medication Sig Dispense Refill Last Dose  . ibuprofen (ADVIL,MOTRIN) 800 MG tablet Take 1 tablet (800 mg total) by mouth every 8 (eight) hours as needed. (Patient not taking: Reported on 07/11/2019) 30 tablet 0 Not Taking at Unknown time   Social History   Socioeconomic History  . Marital status: Single    Spouse name: Not on file  . Number of children: Not on file  . Years of education: Not on file  . Highest education level: Not on file  Occupational History  . Not on file  Tobacco Use  . Smoking status: Current Every Day Smoker  . Smokeless tobacco: Never  Used  Substance and Sexual Activity  . Alcohol use: Yes  . Drug use: Not on file  . Sexual activity: Not on file  Other Topics Concern  . Not on file  Social History Narrative  . Not on file   Social Determinants of Health   Financial Resource Strain:   . Difficulty of Paying Living Expenses:   Food Insecurity:   . Worried About Charity fundraiser in the Last Year:   . Arboriculturist in the Last Year:   Transportation Needs:   . Film/video editor (Medical):   Marland Kitchen Lack of Transportation (Non-Medical):   Physical Activity:   . Days of Exercise per Week:   . Minutes of Exercise per Session:   Stress:   . Feeling of Stress :   Social Connections:   . Frequency of Communication with Friends and Family:   . Frequency of Social Gatherings with Friends and Family:   . Attends Religious Services:   . Active Member of Clubs or Organizations:   . Attends Archivist Meetings:   Marland Kitchen Marital Status:   Intimate Partner Violence:   . Fear of Current or Ex-Partner:   . Emotionally Abused:   Marland Kitchen Physically Abused:   . Sexually Abused:     No family history on file.    Review of systems complete and  found to be negative unless listed above      PHYSICAL EXAM  General: Well developed, well nourished, in no acute distress HEENT:  Normocephalic and atramatic Neck:  No JVD.  Lungs: Clear bilaterally to auscultation and percussion. Heart: HRRR . Normal S1 and S2 without gallops or murmurs.  Abdomen: Bowel sounds are positive, abdomen soft and non-tender  Msk:  Back normal, normal gait. Normal strength and tone for age. Extremities: No clubbing, cyanosis or edema.   Neuro: Alert and oriented X 3. Psych:  Good affect, responds appropriately  Labs:   Lab Results  Component Value Date   WBC 14.8 (H) 07/11/2019   HGB 8.7 (L) 07/11/2019   HCT 30.1 (L) 07/11/2019   MCV 68.1 (L) 07/11/2019   PLT 682 (H) 07/11/2019    Recent Labs  Lab 07/11/19 0616  NA 134*  K 3.9    CL 104  CO2 23  BUN 15  CREATININE 1.13  CALCIUM 8.4*  PROT 6.7  BILITOT 1.5*  ALKPHOS 71  ALT 41  AST 131*  GLUCOSE 120*   No results found for: CKTOTAL, CKMB, CKMBINDEX, TROPONINI  Lab Results  Component Value Date   CHOL 137 07/10/2019   Lab Results  Component Value Date   HDL 25 (L) 07/10/2019   Lab Results  Component Value Date   LDLCALC 93 07/10/2019   Lab Results  Component Value Date   TRIG 94 07/10/2019   Lab Results  Component Value Date   CHOLHDL 5.5 07/10/2019   No results found for: LDLDIRECT    Radiology: DG Chest Portable 1 View  Result Date: 07/10/2019 CLINICAL DATA:  Chest pain. EXAM: PORTABLE CHEST 1 VIEW COMPARISON:  None. FINDINGS: Apparent cardiomegaly. Haziness over the left base. No overt edema. No nodules or masses. No other acute abnormalities. IMPRESSION: Haziness over the left base may be due to patient rotation. It would be difficult to exclude a developing opacity. A PA and lateral chest x-ray is recommended for better evaluation. No overt edema. Cardiomegaly suspected. Electronically Signed   By: Gerome Sam III M.D   On: 07/10/2019 17:11   US Abdomen Limited RUQ  Result Date: 07/10/2019 CLINICAL DATA:  Right upper quadrant pain times 2-3 days EXAM: ULTRASOUND ABDOMEN LIMITED RIGHT UPPER QUADRANT COMPARISON:  None. FINDINGS: Gallbladder: Multiple small, shadowing echogenic gallstones are seen within the gallbladder lumen (the largest measures approximately 8 mm). There is no evidence of gallbladder wall thickening (1.5 mm). No sonographic Murphy sign noted by sonographer. Common bile duct: Diameter: 2.4 mm Liver: No focal lesion identified. There is diffusely increased echogenicity of the liver parenchyma. Portal vein is patent on color Doppler imaging with normal direction of blood flow towards the liver. Other: None. IMPRESSION: 1. Cholelithiasis, without evidence of acute cholecystitis. 2. Fatty liver. Electronically Signed   By:  Aram Candela M.D.   On: 07/10/2019 17:40    EKG:  Normal sinus rhythm nonspecific ST-T wave changes LVH  ASSESSMENT AND PLAN:  Non-STEMI Elevated troponin Hypertension Obesity Anemia GI bleed plan Abnormal Ekg . Plan Recommend conservative therapy for now Agree with GI work-up and evaluation for anemia Continue Protonix therapy for possible reflux or peptic ulcer disease Patient will need GI work-up including colonoscopy Agree with echocardiogram for evaluation post non-STEMI Defer cardiac cath for now until bleeding is controlled and hemoglobin consistently above 8 or 9   Signed: Alwyn Pea MD 07/11/2019, 4:19 PM

## 2019-07-11 NOTE — Plan of Care (Signed)
Pt oriented to rm 8, he states he is leaving soon when awake, but sleeps peacefully between care.  His mood did improve when Dr Sandi Mealy said he could have food.  Insight into his medical condition seems poor, endorses ETOH and tobacco use, no CP this shift, nitro off, transfer to PCU per Dr Belia Heman.  He repositions himself as he likes and has been up to the chair.  VSS on room air

## 2019-07-11 NOTE — Progress Notes (Signed)
*  PRELIMINARY RESULTS* Echocardiogram 2D Echocardiogram has been performed.  Cristela Blue 07/11/2019, 9:37 AM

## 2019-07-11 NOTE — Progress Notes (Signed)
Pt decided to leave hospital AMA, writing RN had encouraged him all day to stay and got a diet ordered for him but by 1530 he was determined to leave.  I talked with him and told him to call 911 of he had any CP or other symptoms.  He seems SOB but VSS on room air.  HE walked down to the MM entrance with me w/ no s/sx distress.  Dr Juliann Pares instructed him to come to [facility] tomorrow AM, I gave thim those instructions on writing and also gave him a note for work.  IV sites DC, no bleeding observed.

## 2019-07-12 LAB — ECHOCARDIOGRAM COMPLETE
Height: 71 in
Weight: 3978.86 oz

## 2019-07-12 LAB — BPAM RBC
Blood Product Expiration Date: 202106192359
Blood Product Expiration Date: 202106282359
ISSUE DATE / TIME: 202105231939
ISSUE DATE / TIME: 202105240213
Unit Type and Rh: 5100
Unit Type and Rh: 5100

## 2019-07-12 LAB — TYPE AND SCREEN
ABO/RH(D): O POS
Antibody Screen: NEGATIVE
Unit division: 0
Unit division: 0

## 2019-07-13 ENCOUNTER — Encounter: Payer: Self-pay | Admitting: Gastroenterology

## 2019-07-13 LAB — CELIAC DISEASE PANEL
Endomysial Ab, IgA: NEGATIVE
IgA: 199 mg/dL (ref 90–386)
Tissue Transglutaminase Ab, IgA: <2 U/mL (ref 0–3)

## 2019-07-19 DEATH — deceased

## 2020-12-05 IMAGING — US US ABDOMEN LIMITED
1 series · 14 of 25 positions shown · non-contrast
Comparison: None.

CLINICAL DATA: Right upper quadrant pain times 2-3 days

EXAM:
ULTRASOUND ABDOMEN LIMITED RIGHT UPPER QUADRANT

[Series 1: us abdomen limited ruq · 14 of 43 slices shown]
[im 1/43]
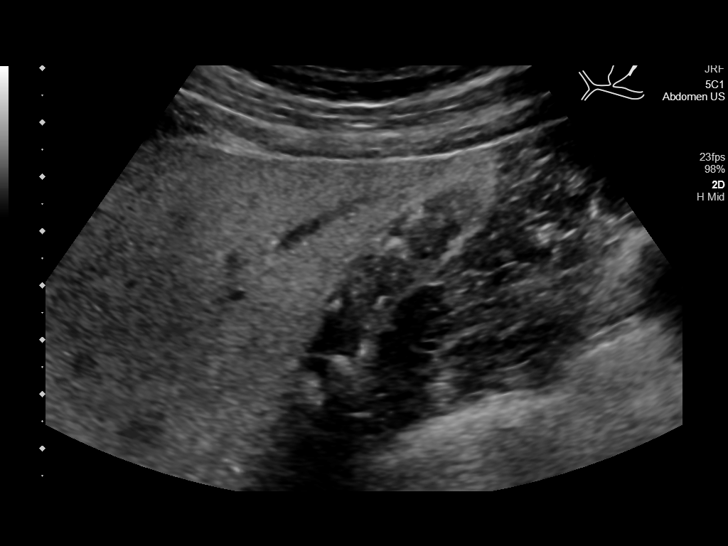
[im 4/43]
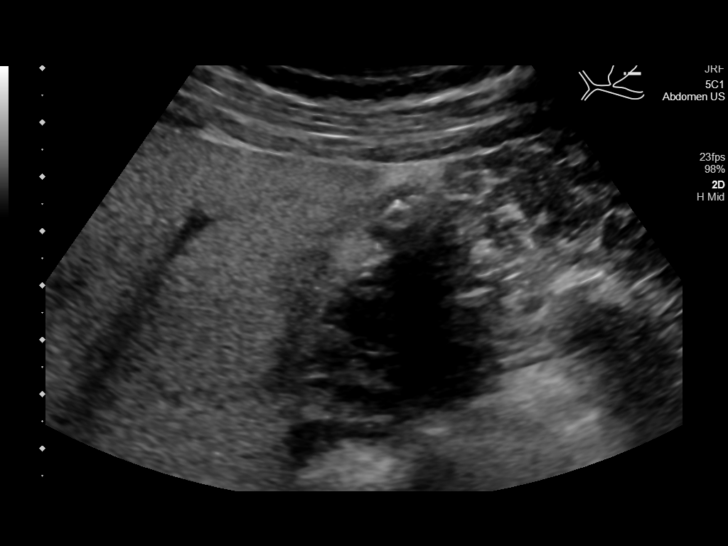
[im 8/43]
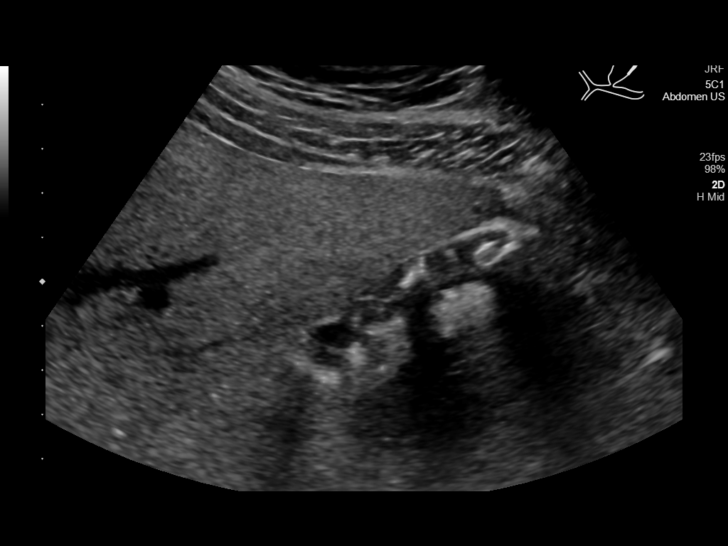
[im 11/43]
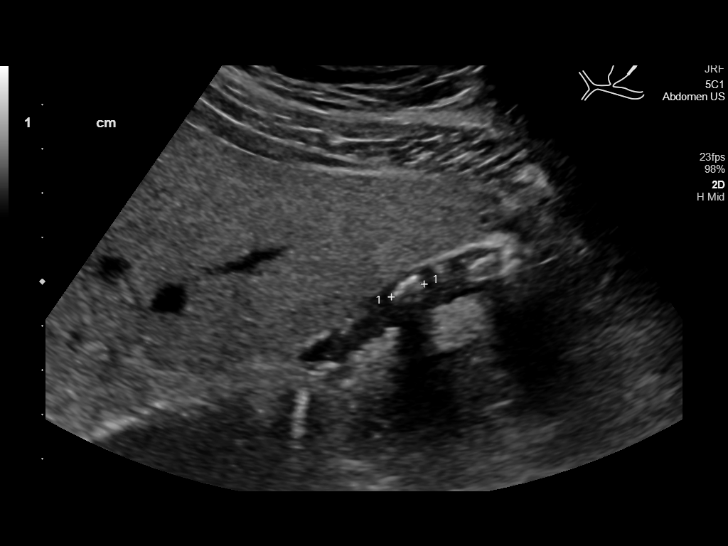
[im 15/43]
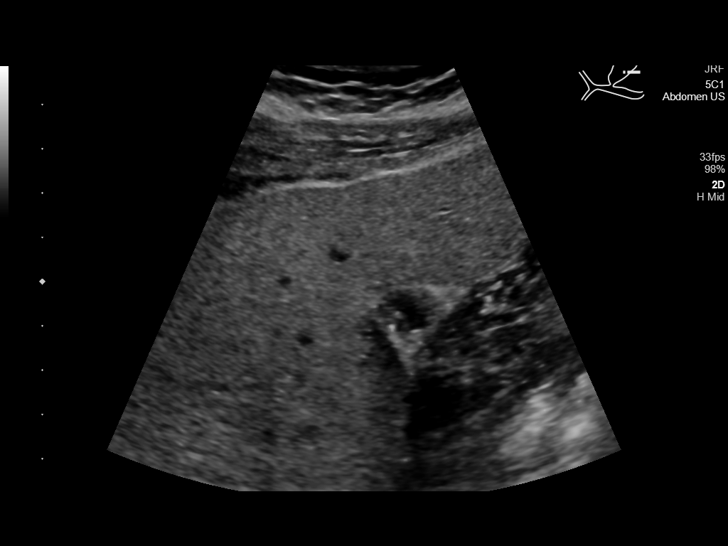
[im 16/43]
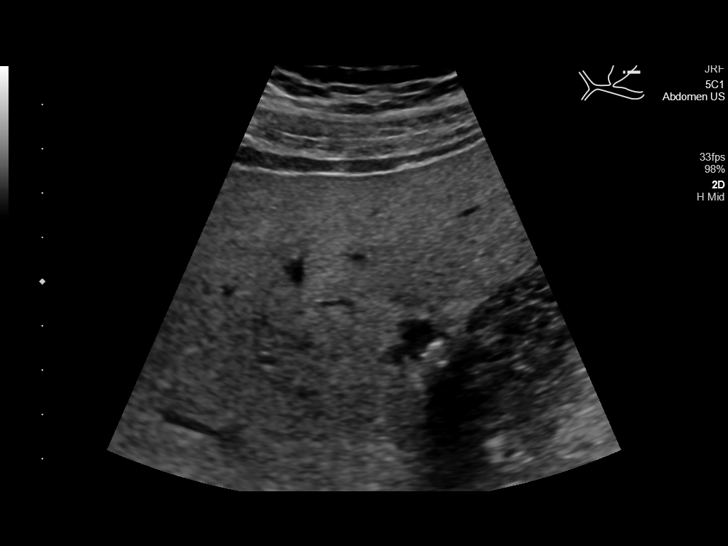
[im 20/43]
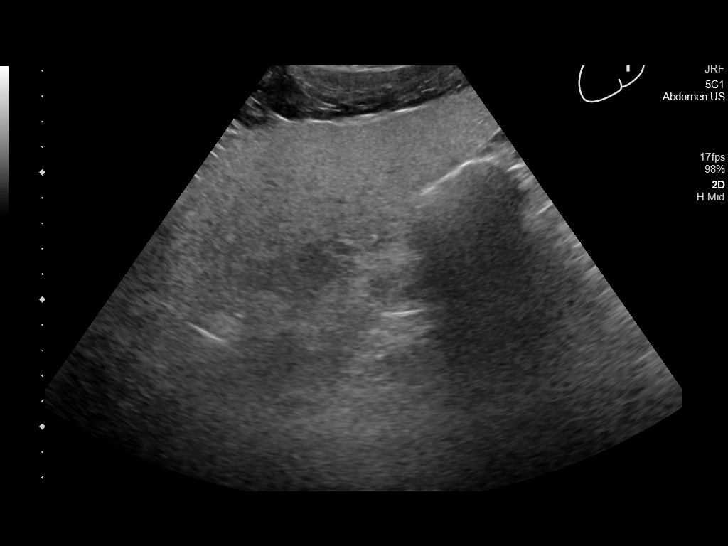
[im 23/43]
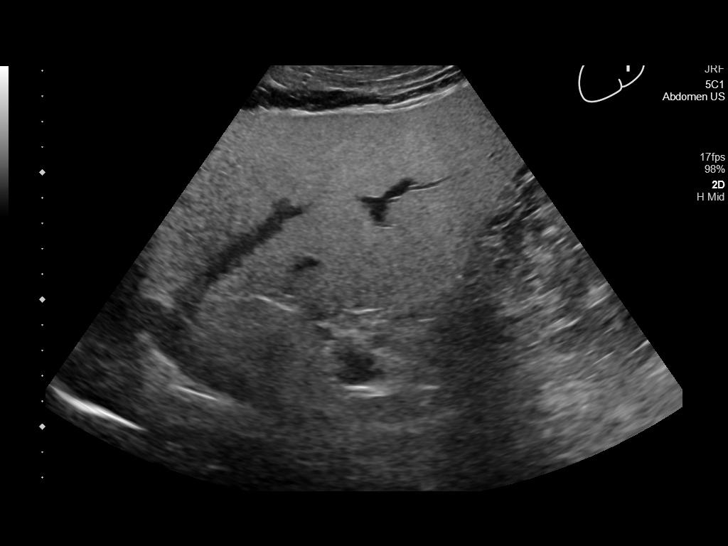
[im 27/43]
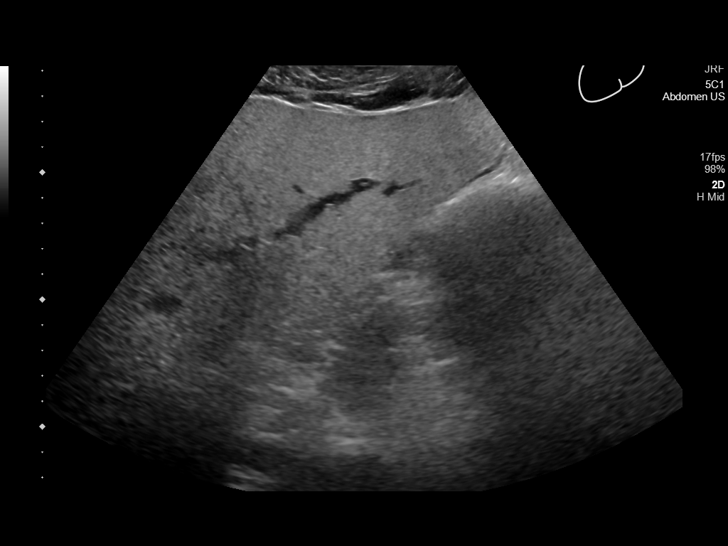
[im 29/43]
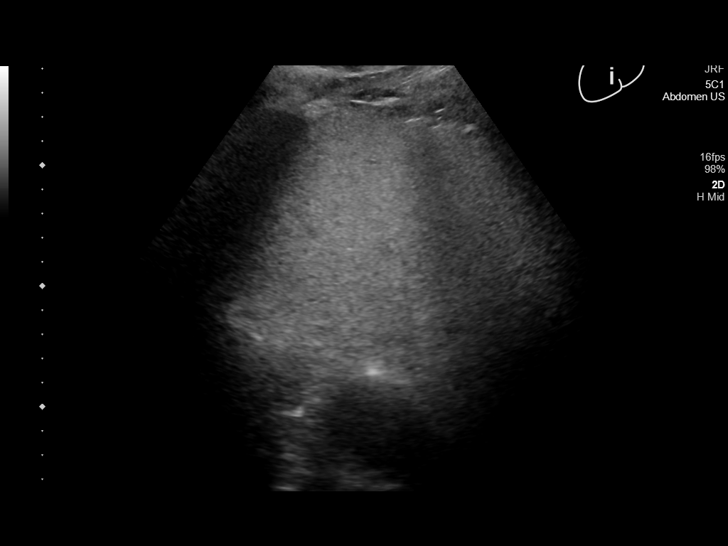
[im 32/43]
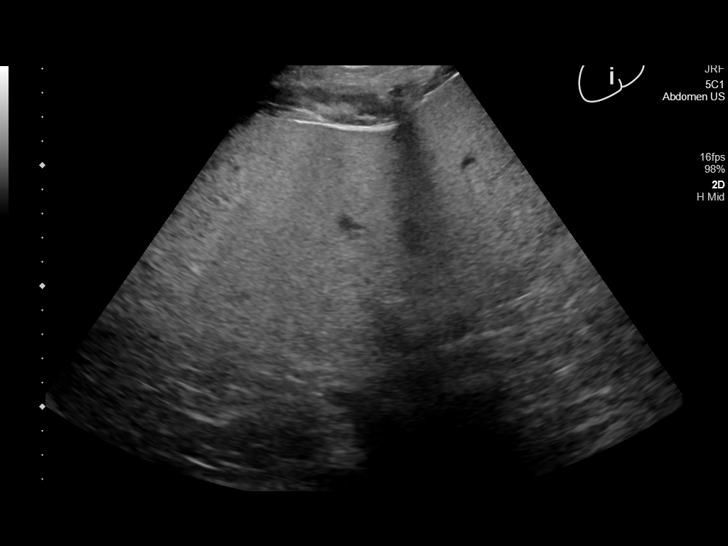
[im 36/43]
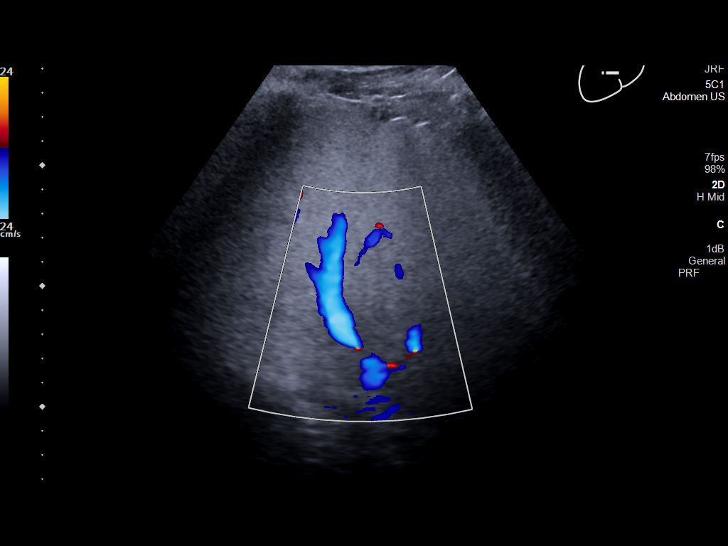
[im 39/43]
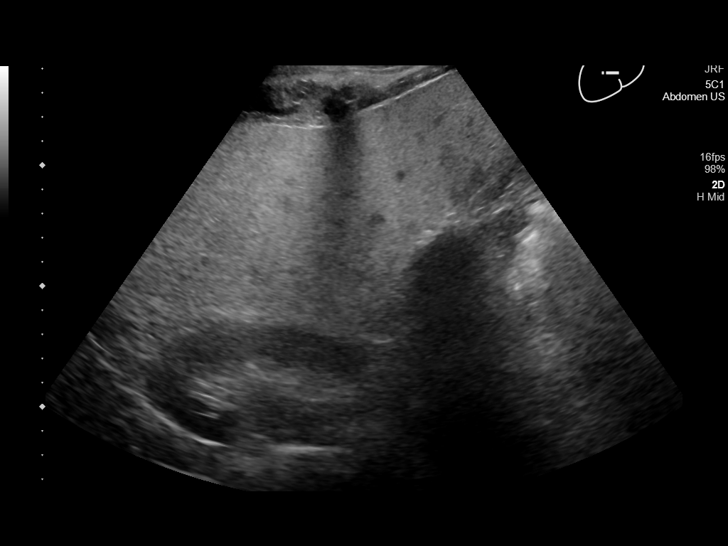
[im 43/43]
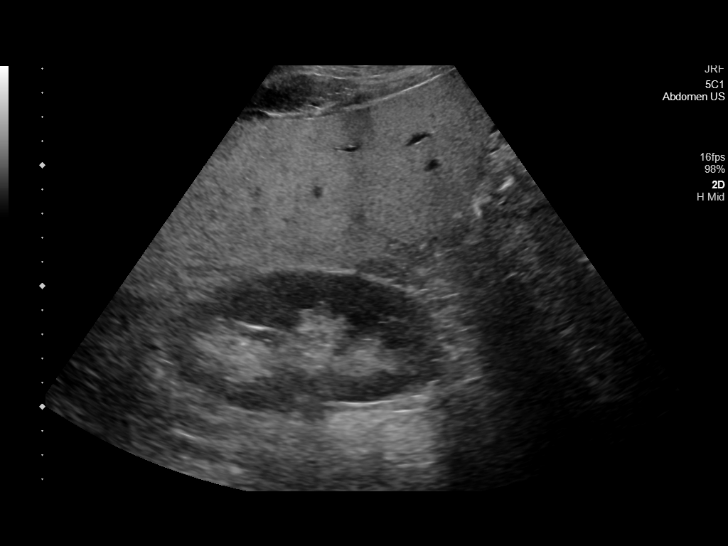

[14 of 25 positions shown; findings below may reference images not displayed]

FINDINGS: Gallbladder:

Multiple small, shadowing echogenic gallstones are seen within the
gallbladder lumen (the largest measures approximately 8 mm). There
is no evidence of gallbladder wall thickening (1.5 mm). No
sonographic Murphy sign noted by sonographer.

Common bile duct:

Diameter: 2.4 mm

Liver:

No focal lesion identified. There is diffusely increased
echogenicity of the liver parenchyma. Portal vein is patent on color
Doppler imaging with normal direction of blood flow towards the
liver.

Other: None.
IMPRESSION: 1. Cholelithiasis, without evidence of acute cholecystitis.
2. Fatty liver.
